# Patient Record
Sex: Female | Born: 1955
Health system: Southern US, Community
[De-identification: ages and names within clinical notes are randomized; demographics above are authoritative.]

---

## 1998-09-05 ENCOUNTER — Other Ambulatory Visit: Admission: RE | Admit: 1998-09-05 | Discharge: 1998-09-05 | Payer: Self-pay | Admitting: Obstetrics and Gynecology

## 1998-10-31 ENCOUNTER — Other Ambulatory Visit: Admission: RE | Admit: 1998-10-31 | Discharge: 1998-10-31 | Payer: Self-pay | Admitting: *Deleted

## 1999-03-17 ENCOUNTER — Other Ambulatory Visit: Admission: RE | Admit: 1999-03-17 | Discharge: 1999-03-17 | Payer: Self-pay | Admitting: *Deleted

## 2000-08-12 ENCOUNTER — Encounter: Payer: Self-pay | Admitting: *Deleted

## 2000-08-12 ENCOUNTER — Encounter: Admission: RE | Admit: 2000-08-12 | Discharge: 2000-08-12 | Payer: Self-pay | Admitting: *Deleted

## 2000-09-02 ENCOUNTER — Other Ambulatory Visit: Admission: RE | Admit: 2000-09-02 | Discharge: 2000-09-02 | Payer: Self-pay | Admitting: *Deleted

## 2001-08-26 ENCOUNTER — Encounter: Payer: Self-pay | Admitting: *Deleted

## 2001-08-26 ENCOUNTER — Encounter: Admission: RE | Admit: 2001-08-26 | Discharge: 2001-08-26 | Payer: Self-pay | Admitting: *Deleted

## 2001-08-29 ENCOUNTER — Encounter: Payer: Self-pay | Admitting: *Deleted

## 2001-08-29 ENCOUNTER — Encounter: Admission: RE | Admit: 2001-08-29 | Discharge: 2001-08-29 | Payer: Self-pay | Admitting: *Deleted

## 2001-10-08 ENCOUNTER — Other Ambulatory Visit: Admission: RE | Admit: 2001-10-08 | Discharge: 2001-10-08 | Payer: Self-pay | Admitting: Obstetrics and Gynecology

## 2002-09-08 ENCOUNTER — Encounter: Admission: RE | Admit: 2002-09-08 | Discharge: 2002-09-08 | Payer: Self-pay | Admitting: *Deleted

## 2002-09-08 ENCOUNTER — Encounter: Payer: Self-pay | Admitting: *Deleted

## 2002-10-30 ENCOUNTER — Other Ambulatory Visit: Admission: RE | Admit: 2002-10-30 | Discharge: 2002-10-30 | Payer: Self-pay | Admitting: Obstetrics and Gynecology

## 2003-10-13 ENCOUNTER — Encounter: Admission: RE | Admit: 2003-10-13 | Discharge: 2003-10-13 | Payer: Self-pay | Admitting: *Deleted

## 2004-10-20 ENCOUNTER — Encounter: Admission: RE | Admit: 2004-10-20 | Discharge: 2004-10-20 | Payer: Self-pay | Admitting: Obstetrics and Gynecology

## 2005-12-12 ENCOUNTER — Encounter: Admission: RE | Admit: 2005-12-12 | Discharge: 2005-12-12 | Payer: Self-pay | Admitting: Obstetrics and Gynecology

## 2007-10-30 ENCOUNTER — Encounter: Admission: RE | Admit: 2007-10-30 | Discharge: 2007-10-30 | Payer: Self-pay | Admitting: Obstetrics and Gynecology

## 2008-11-01 ENCOUNTER — Encounter: Admission: RE | Admit: 2008-11-01 | Discharge: 2008-11-01 | Payer: Self-pay | Admitting: Obstetrics and Gynecology

## 2009-11-02 ENCOUNTER — Encounter: Admission: RE | Admit: 2009-11-02 | Discharge: 2009-11-02 | Payer: Self-pay | Admitting: Obstetrics and Gynecology

## 2010-01-26 ENCOUNTER — Ambulatory Visit: Payer: Self-pay | Admitting: Vascular Surgery

## 2010-03-15 ENCOUNTER — Encounter (INDEPENDENT_AMBULATORY_CARE_PROVIDER_SITE_OTHER): Payer: Self-pay | Admitting: *Deleted

## 2010-03-17 ENCOUNTER — Telehealth: Payer: Self-pay | Admitting: Gastroenterology

## 2010-03-17 ENCOUNTER — Ambulatory Visit: Payer: Self-pay | Admitting: Gastroenterology

## 2010-06-14 ENCOUNTER — Telehealth: Payer: Self-pay | Admitting: Gastroenterology

## 2010-06-16 ENCOUNTER — Ambulatory Visit: Payer: Self-pay | Admitting: Gastroenterology

## 2010-09-17 ENCOUNTER — Encounter: Payer: Self-pay | Admitting: Obstetrics and Gynecology

## 2010-09-26 NOTE — Miscellaneous (Signed)
Summary: DIR COL SCR-AGE/Previsit/rm  Clinical Lists Changes  Medications: Added new medication of MOVIPREP 100 GM  SOLR (PEG-KCL-NACL-NASULF-NA ASC-C) As per prep instructions. - Signed Rx of MOVIPREP 100 GM  SOLR (PEG-KCL-NACL-NASULF-NA ASC-C) As per prep instructions.;  #1 x 0;  Signed;  Entered by: Sherren Kerns RN;  Authorized by: Louis Meckel MD;  Method used: Electronically to [facility]*, [address], [address] Shipping/mailing, [place], [place]  [address], Ph: [phone], Fax: [phone] Observations: Added new observation of ALLERGY REV: Done (03/17/2010 8:59) Added new observation of NKA: T (03/17/2010 8:59)    Prescriptions: MOVIPREP 100 GM  SOLR (PEG-KCL-NACL-NASULF-NA ASC-C) As per prep instructions.  #1 x 0   Entered by:   Sherren Kerns RN   Authorized by:   Louis Meckel MD   Signed by:   Sherren Kerns RN on 03/17/2010   Method used:   Electronically to        [facility]* (retail)       [address]       [address] Shipping/mailing       [place], [place]  [address]       Ph: [phone]       Fax: [phone]   RxID:   [id number]

## 2010-09-26 NOTE — Procedures (Signed)
Summary: Colonoscopy  Patient: Kim Mcintosh Note: All result statuses are Final unless otherwise noted.  Tests: (1) Colonoscopy (COL)   COL Colonoscopy           DONE (C)     [facility]     [address]     [place], [place]  [address]           COLONOSCOPY PROCEDURE REPORT           PATIENT:  Kim, Mcintosh  MR#:  [id number]     BIRTHDATE:  1955/10/09, 54 yrs. old  GENDER:  female           ENDOSCOPIST:  Barbette Hair. Arlyce Dice, MD     Referred by:           PROCEDURE DATE:  06/16/2010     PROCEDURE:  Diagnostic Colonoscopy     ASA CLASS:  Class II     INDICATIONS:  1) Routine Risk Screening           MEDICATIONS:   Fentanyl 100 mcg IV, Versed 10 mg IV, Benadryl 50     mg IV           DESCRIPTION OF PROCEDURE:   After the risks benefits and     alternatives of the procedure were thoroughly explained, informed     consent was obtained.  Digital rectal exam was performed and     revealed no abnormalities.   The LB CF-H180AL [id number] endoscope     was introduced through the anus and advanced to the cecum, which     was identified by both the appendix and ileocecal valve, without     limitations.  The quality of the prep was excellent, using     MiraLax.  The instrument was then slowly withdrawn as the colon     was fully examined.     <<PROCEDUREIMAGES>>           FINDINGS:  Mild diverticulosis was found in the sigmoid colon (see     image1).  This was otherwise a normal examination of the colon     (see image3, image4, image6, image8, image9, image10, image14, and     image16).   Retroflexed views in the rectum revealed no     abnormalities.    The time to cecum =  7.25  minutes. The scope     was then withdrawn (time =  6.25  min) from the patient and the     procedure completed.           COMPLICATIONS:  None           ENDOSCOPIC IMPRESSION:     1) Mild diverticulosis in the sigmoid colon     2) Otherwise normal examination     RECOMMENDATIONS:     1) Continue  current colorectal screening recommendations for     "routine risk" patients with a repeat colonoscopy in 10 years.           REPEAT EXAM:  In 10 year(s) for Colonoscopy.     Sedation with MAC for future     procedures           ______________________________     Barbette Hair. Arlyce Dice, MD           CC: Chilton Greathouse, MD           n.     REVISED:  06/16/2010 10:26 AM     eSIGNED:   Molly Maduro  Rosalio Macadamia at 06/16/2010 10:26 AM           [place], [id number]  Note: An exclamation mark (!) indicates a result that was not dispersed into the flowsheet. Document Creation Date: 06/16/2010 10:26 AM _______________________________________________________________________  (1) Order result status: Final Collection or observation date-time: 06/16/2010 10:04 Requested date-time:  Receipt date-time:  Reported date-time:  Referring Physician:   Ordering Physician: Melvia Heaps [phone]) Specimen Source:  Source: Launa Grill Order Number: [id number] Lab site:   Appended Document: Colonoscopy    Clinical Lists Changes  Observations: Added new observation of COLONNXTDUE: 05/2020 (06/16/2010 13:50)

## 2010-09-26 NOTE — Progress Notes (Signed)
Summary: Question about her COL  Phone Note Call from Patient Call back at Work Phone [phone]   Caller: Patient Call For: Dr. Arlyce Dice Summary of Call: pt. has some questions about her COL Initial call taken by: Karna Christmas,  March 17, 2010 2:37 PM  Follow-up for Phone Call        call patient back,she is "at lunch",message left for her to call me back. Sherren Kerns RN  March 17, 2010 3:49 PM  Follow-up by: Sherren Kerns RN,  March 17, 2010 3:49 PM  Additional Follow-up for Phone Call Additional follow up Details #1::        spoke with patient via  phone, answered questions/concerns. Additional Follow-up by: Sherren Kerns RN,  March 17, 2010 4:31 PM

## 2010-09-26 NOTE — Progress Notes (Signed)
Summary: Questions about Meds.  Phone Note Call from Patient Call back at Work Phone [phone]   Caller: Patient Call For: Dr. Arlyce Dice Summary of Call: Pt is now taking Methotrexate...wants to know if it will interfere with her COL Initial call taken by: Karna Christmas,  June 14, 2010 9:13 AM  Follow-up for Phone Call        Phone Call Completed Follow-up by: Wyona Almas RN,  June 14, 2010 12:42 PM

## 2010-09-26 NOTE — Letter (Signed)
Summary: [facility] Instructions  [facility]  [address] [place], [place] [address]   Phone: [phone]  Fax: [phone]       Kim Mcintosh    06-11-1956    MRN: [id number]        Procedure Day /Date:  Friday 03/31/2010     Arrival Time: 9:00 am     Procedure Time: 10:00 am     Location of Procedure:                    _x _  [facility] (4th Floor)                        PREPARATION FOR COLONOSCOPY WITH MOVIPREP   Starting 5 days prior to your procedure Sunday 7/31 do not eat nuts, seeds, popcorn, corn, beans, peas,  salads, or any raw vegetables.  Do not take any fiber supplements (e.g. Metamucil, Citrucel, and Benefiber).  THE DAY BEFORE YOUR PROCEDURE         DATE: Thursday 8/4 1.  Drink clear liquids the entire day-NO SOLID FOOD  2.  Do not drink anything colored red or purple.  Avoid juices with pulp.  No orange juice.  3.  Drink at least 64 oz. (8 glasses) of fluid/clear liquids during the day to prevent dehydration and help the prep work efficiently.  CLEAR LIQUIDS INCLUDE: Water Jello Ice Popsicles Tea (sugar ok, no milk/cream) Powdered fruit flavored drinks Coffee (sugar ok, no milk/cream) Gatorade Juice: apple, white grape, white cranberry  Lemonade Clear bullion, consomm, broth Carbonated beverages (any kind) Strained chicken noodle soup Hard Candy                             4.  In the morning, mix first dose of MoviPrep solution:    Empty 1 Pouch A and 1 Pouch B into the disposable container    Add lukewarm drinking water to the top line of the container. Mix to dissolve    Refrigerate (mixed solution should be used within 24 hrs)  5.  Begin drinking the prep at 5:00 p.m. The MoviPrep container is divided by 4 marks.   Every 15 minutes drink the solution down to the next mark (approximately 8 oz) until the full liter is complete.   6.  Follow completed prep with 16 oz of clear liquid of your choice (Nothing red or  purple).  Continue to drink clear liquids until bedtime.  7.  Before going to bed, mix second dose of MoviPrep solution:    Empty 1 Pouch A and 1 Pouch B into the disposable container    Add lukewarm drinking water to the top line of the container. Mix to dissolve    Refrigerate  THE DAY OF YOUR PROCEDURE      DATE: Friday 8/5  Beginning at 5:00 a.m. (5 hours before procedure):         1. Every 15 minutes, drink the solution down to the next mark (approx 8 oz) until the full liter is complete.  2. Follow completed prep with 16 oz. of clear liquid of your choice.    3. You may drink clear liquids until 8:00 am (2 HOURS BEFORE PROCEDURE).   MEDICATION INSTRUCTIONS  Unless otherwise instructed, you should take regular prescription medications with a small sip of water   as early as possible the morning of your procedure.  Additional medication instructions: hold diuretic pill morning of procedure only         OTHER INSTRUCTIONS  You will need a responsible adult at least 55 years of age to accompany you and drive you home.   This person must remain in the waiting room during your procedure.  Wear loose fitting clothing that is easily removed.  Leave jewelry and other valuables at home.  However, you may wish to bring a book to read or  an iPod/MP3 player to listen to music as you wait for your procedure to start.  Remove all body piercing jewelry and leave at home.  Total time from sign-in until discharge is approximately 2-3 hours.  You should go home directly after your procedure and rest.  You can resume normal activities the  day after your procedure.  The day of your procedure you should not:   Drive   Make legal decisions   Operate machinery   Drink alcohol   Return to work  You will receive specific instructions about eating, activities and medications before you leave.    The above instructions have been reviewed and explained to me by  Sherren Kerns RN  March 17, 2010 9:22 AM     I fully understand and can verbalize these instructions _____________________________ Date _________

## 2010-11-20 ENCOUNTER — Other Ambulatory Visit: Payer: Self-pay | Admitting: Obstetrics and Gynecology

## 2010-11-20 DIAGNOSIS — Z1231 Encounter for screening mammogram for malignant neoplasm of breast: Secondary | ICD-10-CM

## 2010-11-28 ENCOUNTER — Ambulatory Visit
Admission: RE | Admit: 2010-11-28 | Discharge: 2010-11-28 | Disposition: A | Discharge status: Home / Self Care | Payer: Commercial Managed Care - PPO | Source: Ambulatory Visit | Attending: Obstetrics and Gynecology | Admitting: Obstetrics and Gynecology

## 2010-11-28 DIAGNOSIS — Z1231 Encounter for screening mammogram for malignant neoplasm of breast: Secondary | ICD-10-CM

## 2011-01-09 NOTE — Procedures (Signed)
DUPLEX DEEP VENOUS EXAM - LOWER EXTREMITY   INDICATION:  Edema.   HISTORY:  Edema:  Right leg.  Trauma/Surgery:  Pain:  No.  PE:  No.  Previous DVT:  No.  Anticoagulants:  No.  Other:   DUPLEX EXAM:                CFV   SFV   PopV  PTV    GSV                R  L  R  L  R  L  R   L  R  L  Thrombosis    0  0  0     0     0      0  Spontaneous   +  +  +     +     +      +  Phasic        +  +  +     +     +      +  Augmentation  +  +  +     +     +      +  Compressible  +  +  +     +     +      +  Competent     +  +  +     +     +      +   Legend:  + - yes  o - no  p - partial  D - decreased   IMPRESSION:  1. The right leg appears to be negative for deep venous thrombosis.  2. Preliminary report called in to Dr. Vicente Males office, spoke with      Herbert Seta.    _____________________________  Di Kindle. Edilia Bo, M.D.   NT/MEDQ  D:  01/26/2010  T:  01/26/2010  Job:  [id number]

## 2011-12-12 ENCOUNTER — Other Ambulatory Visit (HOSPITAL_COMMUNITY): Payer: Self-pay | Admitting: Obstetrics and Gynecology

## 2011-12-12 DIAGNOSIS — Z1231 Encounter for screening mammogram for malignant neoplasm of breast: Secondary | ICD-10-CM

## 2012-01-01 ENCOUNTER — Other Ambulatory Visit: Payer: Self-pay | Admitting: Obstetrics and Gynecology

## 2012-01-01 DIAGNOSIS — Z1231 Encounter for screening mammogram for malignant neoplasm of breast: Secondary | ICD-10-CM

## 2012-01-29 ENCOUNTER — Ambulatory Visit: Payer: Commercial Managed Care - PPO

## 2012-02-05 ENCOUNTER — Ambulatory Visit: Payer: Commercial Managed Care - PPO

## 2012-03-05 ENCOUNTER — Ambulatory Visit
Admission: RE | Admit: 2012-03-05 | Discharge: 2012-03-05 | Disposition: A | Discharge status: Home / Self Care | Payer: Commercial Managed Care - PPO | Source: Ambulatory Visit | Attending: Obstetrics and Gynecology | Admitting: Obstetrics and Gynecology

## 2012-03-05 DIAGNOSIS — Z1231 Encounter for screening mammogram for malignant neoplasm of breast: Secondary | ICD-10-CM

## 2013-07-07 ENCOUNTER — Other Ambulatory Visit (HOSPITAL_COMMUNITY): Payer: Self-pay | Admitting: Obstetrics and Gynecology

## 2013-07-07 DIAGNOSIS — Z1231 Encounter for screening mammogram for malignant neoplasm of breast: Secondary | ICD-10-CM

## 2013-07-22 ENCOUNTER — Ambulatory Visit (HOSPITAL_COMMUNITY)
Admission: RE | Admit: 2013-07-22 | Discharge: 2013-07-22 | Disposition: A | Discharge status: Home / Self Care | Payer: 59 | Source: Ambulatory Visit | Attending: Obstetrics and Gynecology | Admitting: Obstetrics and Gynecology

## 2013-07-22 DIAGNOSIS — Z1231 Encounter for screening mammogram for malignant neoplasm of breast: Secondary | ICD-10-CM

## 2013-07-22 DIAGNOSIS — Z124 Encounter for screening for malignant neoplasm of cervix: Secondary | ICD-10-CM | POA: Insufficient documentation

## 2014-10-06 ENCOUNTER — Other Ambulatory Visit (HOSPITAL_COMMUNITY): Payer: Self-pay | Admitting: Obstetrics and Gynecology

## 2014-10-06 DIAGNOSIS — Z1231 Encounter for screening mammogram for malignant neoplasm of breast: Secondary | ICD-10-CM

## 2014-10-08 ENCOUNTER — Ambulatory Visit (HOSPITAL_COMMUNITY)
Admission: RE | Admit: 2014-10-08 | Discharge: 2014-10-08 | Disposition: A | Discharge status: Home / Self Care | Payer: 59 | Source: Ambulatory Visit | Attending: Obstetrics and Gynecology | Admitting: Obstetrics and Gynecology

## 2014-10-08 DIAGNOSIS — Z1231 Encounter for screening mammogram for malignant neoplasm of breast: Secondary | ICD-10-CM | POA: Insufficient documentation

## 2015-02-25 ENCOUNTER — Encounter: Payer: Self-pay | Admitting: Gastroenterology

## 2015-09-08 MED FILL — FOLIC ACID 1 MG TABLET: 1 | 90 days supply | Qty: 90 | Fill #1 | Status: TO

## 2015-09-08 MED FILL — LEVOTHYROXINE 75 MCG TABLET: 75 | 90 days supply | Qty: 90 | Fill #0 | Status: TO

## 2015-09-08 MED FILL — METHOTREXATE 25 MG/ML VIAL: 50 | 42 days supply | Qty: 4 | Fill #0

## 2015-09-12 DIAGNOSIS — D225 Melanocytic nevi of trunk: Secondary | ICD-10-CM | POA: Diagnosis not present

## 2015-09-12 DIAGNOSIS — D1801 Hemangioma of skin and subcutaneous tissue: Secondary | ICD-10-CM | POA: Diagnosis not present

## 2015-09-12 DIAGNOSIS — L821 Other seborrheic keratosis: Secondary | ICD-10-CM | POA: Diagnosis not present

## 2015-10-05 ENCOUNTER — Other Ambulatory Visit: Payer: Self-pay

## 2015-10-05 DIAGNOSIS — Z1231 Encounter for screening mammogram for malignant neoplasm of breast: Secondary | ICD-10-CM

## 2015-10-13 ENCOUNTER — Ambulatory Visit
Admission: RE | Admit: 2015-10-13 | Discharge: 2015-10-13 | Disposition: A | Discharge status: Home / Self Care | Payer: 59 | Source: Ambulatory Visit

## 2015-10-13 DIAGNOSIS — Z1231 Encounter for screening mammogram for malignant neoplasm of breast: Secondary | ICD-10-CM

## 2015-10-26 MED FILL — BD TB SYRINGE 27GX1/2: 27G X 1/2" | 105 days supply | Qty: 15 | Fill #1

## 2015-10-26 MED FILL — METHOTREXATE 25 MG/ML VIAL: 50 | 42 days supply | Qty: 4 | Fill #1 | Status: TO

## 2015-12-16 MED FILL — LEVOTHYROXINE 75 MCG TABLET: 75 | 4 days supply | Qty: 4 | Fill #1 | Status: TO

## 2015-12-21 DIAGNOSIS — E039 Hypothyroidism, unspecified: Secondary | ICD-10-CM | POA: Diagnosis not present

## 2015-12-21 DIAGNOSIS — E559 Vitamin D deficiency, unspecified: Secondary | ICD-10-CM | POA: Diagnosis not present

## 2015-12-21 DIAGNOSIS — Z0000 Encounter for general adult medical examination without abnormal findings: Secondary | ICD-10-CM | POA: Diagnosis not present

## 2015-12-21 DIAGNOSIS — E784 Other hyperlipidemia: Secondary | ICD-10-CM | POA: Diagnosis not present

## 2015-12-28 DIAGNOSIS — Z1389 Encounter for screening for other disorder: Secondary | ICD-10-CM | POA: Diagnosis not present

## 2015-12-28 DIAGNOSIS — F39 Unspecified mood [affective] disorder: Secondary | ICD-10-CM | POA: Diagnosis not present

## 2015-12-28 DIAGNOSIS — M069 Rheumatoid arthritis, unspecified: Secondary | ICD-10-CM | POA: Diagnosis not present

## 2015-12-28 DIAGNOSIS — E784 Other hyperlipidemia: Secondary | ICD-10-CM | POA: Diagnosis not present

## 2015-12-28 DIAGNOSIS — Z0000 Encounter for general adult medical examination without abnormal findings: Secondary | ICD-10-CM | POA: Diagnosis not present

## 2015-12-28 DIAGNOSIS — M8580 Other specified disorders of bone density and structure, unspecified site: Secondary | ICD-10-CM | POA: Diagnosis not present

## 2015-12-30 DIAGNOSIS — Z1212 Encounter for screening for malignant neoplasm of rectum: Secondary | ICD-10-CM | POA: Diagnosis not present

## 2016-02-17 DIAGNOSIS — R87610 Atypical squamous cells of undetermined significance on cytologic smear of cervix (ASC-US): Secondary | ICD-10-CM | POA: Diagnosis not present

## 2016-02-17 DIAGNOSIS — Z01419 Encounter for gynecological examination (general) (routine) without abnormal findings: Secondary | ICD-10-CM | POA: Diagnosis not present

## 2016-02-17 DIAGNOSIS — Z6823 Body mass index (BMI) 23.0-23.9, adult: Secondary | ICD-10-CM | POA: Diagnosis not present

## 2016-03-14 DIAGNOSIS — E038 Other specified hypothyroidism: Secondary | ICD-10-CM | POA: Diagnosis not present

## 2016-04-25 DIAGNOSIS — M0579 Rheumatoid arthritis with rheumatoid factor of multiple sites without organ or systems involvement: Secondary | ICD-10-CM | POA: Diagnosis not present

## 2016-04-25 DIAGNOSIS — M79642 Pain in left hand: Secondary | ICD-10-CM | POA: Diagnosis not present

## 2016-04-25 DIAGNOSIS — M7702 Medial epicondylitis, left elbow: Secondary | ICD-10-CM | POA: Diagnosis not present

## 2016-04-25 DIAGNOSIS — M79641 Pain in right hand: Secondary | ICD-10-CM | POA: Diagnosis not present

## 2016-04-25 DIAGNOSIS — Z79899 Other long term (current) drug therapy: Secondary | ICD-10-CM | POA: Diagnosis not present

## 2016-05-23 DIAGNOSIS — Z23 Encounter for immunization: Secondary | ICD-10-CM | POA: Diagnosis not present

## 2016-07-09 DIAGNOSIS — Z713 Dietary counseling and surveillance: Secondary | ICD-10-CM | POA: Diagnosis not present

## 2016-07-24 DIAGNOSIS — Z713 Dietary counseling and surveillance: Secondary | ICD-10-CM | POA: Diagnosis not present

## 2016-07-30 DIAGNOSIS — Z713 Dietary counseling and surveillance: Secondary | ICD-10-CM | POA: Diagnosis not present

## 2016-08-07 DIAGNOSIS — Z713 Dietary counseling and surveillance: Secondary | ICD-10-CM | POA: Diagnosis not present

## 2016-09-03 DIAGNOSIS — Z713 Dietary counseling and surveillance: Secondary | ICD-10-CM | POA: Diagnosis not present

## 2016-09-17 DIAGNOSIS — Z713 Dietary counseling and surveillance: Secondary | ICD-10-CM | POA: Diagnosis not present

## 2016-11-16 ENCOUNTER — Other Ambulatory Visit: Payer: Self-pay | Admitting: Obstetrics and Gynecology

## 2016-11-16 DIAGNOSIS — Z1231 Encounter for screening mammogram for malignant neoplasm of breast: Secondary | ICD-10-CM

## 2016-11-23 ENCOUNTER — Ambulatory Visit
Admission: RE | Admit: 2016-11-23 | Discharge: 2016-11-23 | Disposition: A | Discharge status: Home / Self Care | Payer: BLUE CROSS/BLUE SHIELD | Source: Ambulatory Visit | Attending: Obstetrics and Gynecology | Admitting: Obstetrics and Gynecology

## 2016-11-23 ENCOUNTER — Other Ambulatory Visit: Payer: Self-pay | Admitting: Obstetrics and Gynecology

## 2016-11-23 DIAGNOSIS — Z1231 Encounter for screening mammogram for malignant neoplasm of breast: Secondary | ICD-10-CM

## 2017-02-28 DIAGNOSIS — E038 Other specified hypothyroidism: Secondary | ICD-10-CM | POA: Diagnosis not present

## 2017-04-03 DIAGNOSIS — Z0000 Encounter for general adult medical examination without abnormal findings: Secondary | ICD-10-CM | POA: Diagnosis not present

## 2017-04-03 DIAGNOSIS — E038 Other specified hypothyroidism: Secondary | ICD-10-CM | POA: Diagnosis not present

## 2017-04-03 DIAGNOSIS — M859 Disorder of bone density and structure, unspecified: Secondary | ICD-10-CM | POA: Diagnosis not present

## 2017-04-11 DIAGNOSIS — M859 Disorder of bone density and structure, unspecified: Secondary | ICD-10-CM | POA: Diagnosis not present

## 2017-04-11 DIAGNOSIS — E784 Other hyperlipidemia: Secondary | ICD-10-CM | POA: Diagnosis not present

## 2017-04-11 DIAGNOSIS — Z1389 Encounter for screening for other disorder: Secondary | ICD-10-CM | POA: Diagnosis not present

## 2017-04-11 DIAGNOSIS — M069 Rheumatoid arthritis, unspecified: Secondary | ICD-10-CM | POA: Diagnosis not present

## 2017-04-11 DIAGNOSIS — J302 Other seasonal allergic rhinitis: Secondary | ICD-10-CM | POA: Diagnosis not present

## 2017-04-11 DIAGNOSIS — Z0000 Encounter for general adult medical examination without abnormal findings: Secondary | ICD-10-CM | POA: Diagnosis not present

## 2017-04-12 DIAGNOSIS — Z1212 Encounter for screening for malignant neoplasm of rectum: Secondary | ICD-10-CM | POA: Diagnosis not present

## 2017-04-24 DIAGNOSIS — B9789 Other viral agents as the cause of diseases classified elsewhere: Secondary | ICD-10-CM | POA: Diagnosis not present

## 2017-04-24 DIAGNOSIS — J45909 Unspecified asthma, uncomplicated: Secondary | ICD-10-CM | POA: Diagnosis not present

## 2017-04-24 DIAGNOSIS — R05 Cough: Secondary | ICD-10-CM | POA: Diagnosis not present

## 2017-04-24 DIAGNOSIS — J302 Other seasonal allergic rhinitis: Secondary | ICD-10-CM | POA: Diagnosis not present

## 2017-05-08 DIAGNOSIS — Z713 Dietary counseling and surveillance: Secondary | ICD-10-CM | POA: Diagnosis not present

## 2017-05-22 DIAGNOSIS — Z713 Dietary counseling and surveillance: Secondary | ICD-10-CM | POA: Diagnosis not present

## 2017-05-24 DIAGNOSIS — Z23 Encounter for immunization: Secondary | ICD-10-CM | POA: Diagnosis not present

## 2017-06-05 DIAGNOSIS — Z713 Dietary counseling and surveillance: Secondary | ICD-10-CM | POA: Diagnosis not present

## 2017-06-19 DIAGNOSIS — Z713 Dietary counseling and surveillance: Secondary | ICD-10-CM | POA: Diagnosis not present

## 2017-07-03 DIAGNOSIS — Z713 Dietary counseling and surveillance: Secondary | ICD-10-CM | POA: Diagnosis not present

## 2017-07-10 DIAGNOSIS — Z713 Dietary counseling and surveillance: Secondary | ICD-10-CM | POA: Diagnosis not present

## 2017-09-18 DIAGNOSIS — Z713 Dietary counseling and surveillance: Secondary | ICD-10-CM | POA: Diagnosis not present

## 2017-10-02 DIAGNOSIS — Z713 Dietary counseling and surveillance: Secondary | ICD-10-CM | POA: Diagnosis not present

## 2017-10-16 DIAGNOSIS — Z713 Dietary counseling and surveillance: Secondary | ICD-10-CM | POA: Diagnosis not present

## 2017-10-30 DIAGNOSIS — Z713 Dietary counseling and surveillance: Secondary | ICD-10-CM | POA: Diagnosis not present

## 2017-11-04 ENCOUNTER — Other Ambulatory Visit: Payer: Self-pay | Admitting: Obstetrics and Gynecology

## 2017-11-04 DIAGNOSIS — H0230 Blepharochalasis unspecified eye, unspecified eyelid: Secondary | ICD-10-CM | POA: Diagnosis not present

## 2017-11-04 DIAGNOSIS — Z139 Encounter for screening, unspecified: Secondary | ICD-10-CM

## 2017-11-13 DIAGNOSIS — Z713 Dietary counseling and surveillance: Secondary | ICD-10-CM | POA: Diagnosis not present

## 2017-11-25 ENCOUNTER — Ambulatory Visit: Payer: BLUE CROSS/BLUE SHIELD

## 2017-11-27 DIAGNOSIS — Z713 Dietary counseling and surveillance: Secondary | ICD-10-CM | POA: Diagnosis not present

## 2017-11-28 ENCOUNTER — Ambulatory Visit
Admission: RE | Admit: 2017-11-28 | Discharge: 2017-11-28 | Disposition: A | Discharge status: Home / Self Care | Payer: BLUE CROSS/BLUE SHIELD | Source: Ambulatory Visit | Attending: Obstetrics and Gynecology | Admitting: Obstetrics and Gynecology

## 2017-11-28 DIAGNOSIS — Z1231 Encounter for screening mammogram for malignant neoplasm of breast: Secondary | ICD-10-CM | POA: Diagnosis not present

## 2017-11-28 DIAGNOSIS — Z139 Encounter for screening, unspecified: Secondary | ICD-10-CM

## 2018-04-17 DIAGNOSIS — Z79899 Other long term (current) drug therapy: Secondary | ICD-10-CM | POA: Diagnosis not present

## 2018-04-17 DIAGNOSIS — M0579 Rheumatoid arthritis with rheumatoid factor of multiple sites without organ or systems involvement: Secondary | ICD-10-CM | POA: Diagnosis not present

## 2018-04-17 DIAGNOSIS — M7989 Other specified soft tissue disorders: Secondary | ICD-10-CM | POA: Diagnosis not present

## 2018-04-17 DIAGNOSIS — M79643 Pain in unspecified hand: Secondary | ICD-10-CM | POA: Diagnosis not present

## 2018-04-17 DIAGNOSIS — M79605 Pain in left leg: Secondary | ICD-10-CM | POA: Diagnosis not present

## 2018-04-29 DIAGNOSIS — Z0000 Encounter for general adult medical examination without abnormal findings: Secondary | ICD-10-CM | POA: Diagnosis not present

## 2018-04-29 DIAGNOSIS — M859 Disorder of bone density and structure, unspecified: Secondary | ICD-10-CM | POA: Diagnosis not present

## 2018-04-29 DIAGNOSIS — M8580 Other specified disorders of bone density and structure, unspecified site: Secondary | ICD-10-CM | POA: Diagnosis not present

## 2018-04-29 DIAGNOSIS — E038 Other specified hypothyroidism: Secondary | ICD-10-CM | POA: Diagnosis not present

## 2018-05-06 DIAGNOSIS — E7849 Other hyperlipidemia: Secondary | ICD-10-CM | POA: Diagnosis not present

## 2018-05-06 DIAGNOSIS — Z0000 Encounter for general adult medical examination without abnormal findings: Secondary | ICD-10-CM | POA: Diagnosis not present

## 2018-05-06 DIAGNOSIS — M859 Disorder of bone density and structure, unspecified: Secondary | ICD-10-CM | POA: Diagnosis not present

## 2018-05-06 DIAGNOSIS — M0689 Other specified rheumatoid arthritis, multiple sites: Secondary | ICD-10-CM | POA: Diagnosis not present

## 2018-05-06 DIAGNOSIS — Z1389 Encounter for screening for other disorder: Secondary | ICD-10-CM | POA: Diagnosis not present

## 2018-05-06 DIAGNOSIS — J302 Other seasonal allergic rhinitis: Secondary | ICD-10-CM | POA: Diagnosis not present

## 2018-05-09 DIAGNOSIS — Z1212 Encounter for screening for malignant neoplasm of rectum: Secondary | ICD-10-CM | POA: Diagnosis not present

## 2018-05-23 DIAGNOSIS — Z23 Encounter for immunization: Secondary | ICD-10-CM | POA: Diagnosis not present

## 2018-05-27 DIAGNOSIS — Z13820 Encounter for screening for osteoporosis: Secondary | ICD-10-CM | POA: Diagnosis not present

## 2018-07-01 DIAGNOSIS — Z713 Dietary counseling and surveillance: Secondary | ICD-10-CM | POA: Diagnosis not present

## 2018-07-08 DIAGNOSIS — Z713 Dietary counseling and surveillance: Secondary | ICD-10-CM | POA: Diagnosis not present

## 2018-07-15 DIAGNOSIS — Z713 Dietary counseling and surveillance: Secondary | ICD-10-CM | POA: Diagnosis not present

## 2018-07-29 DIAGNOSIS — Z713 Dietary counseling and surveillance: Secondary | ICD-10-CM | POA: Diagnosis not present

## 2018-08-12 DIAGNOSIS — Z713 Dietary counseling and surveillance: Secondary | ICD-10-CM | POA: Diagnosis not present

## 2018-09-01 DIAGNOSIS — M0579 Rheumatoid arthritis with rheumatoid factor of multiple sites without organ or systems involvement: Secondary | ICD-10-CM | POA: Diagnosis not present

## 2018-09-01 DIAGNOSIS — Z79899 Other long term (current) drug therapy: Secondary | ICD-10-CM | POA: Diagnosis not present

## 2018-09-02 DIAGNOSIS — Z79899 Other long term (current) drug therapy: Secondary | ICD-10-CM | POA: Diagnosis not present

## 2018-09-02 DIAGNOSIS — M0579 Rheumatoid arthritis with rheumatoid factor of multiple sites without organ or systems involvement: Secondary | ICD-10-CM | POA: Diagnosis not present

## 2018-09-09 DIAGNOSIS — Z713 Dietary counseling and surveillance: Secondary | ICD-10-CM | POA: Diagnosis not present

## 2018-12-24 ENCOUNTER — Other Ambulatory Visit: Payer: Self-pay | Admitting: Obstetrics and Gynecology

## 2018-12-24 DIAGNOSIS — Z1231 Encounter for screening mammogram for malignant neoplasm of breast: Secondary | ICD-10-CM

## 2018-12-25 DIAGNOSIS — Z6826 Body mass index (BMI) 26.0-26.9, adult: Secondary | ICD-10-CM | POA: Diagnosis not present

## 2018-12-25 DIAGNOSIS — Z124 Encounter for screening for malignant neoplasm of cervix: Secondary | ICD-10-CM | POA: Diagnosis not present

## 2018-12-25 DIAGNOSIS — R635 Abnormal weight gain: Secondary | ICD-10-CM | POA: Diagnosis not present

## 2018-12-25 DIAGNOSIS — Z113 Encounter for screening for infections with a predominantly sexual mode of transmission: Secondary | ICD-10-CM | POA: Diagnosis not present

## 2018-12-25 DIAGNOSIS — Z1151 Encounter for screening for human papillomavirus (HPV): Secondary | ICD-10-CM | POA: Diagnosis not present

## 2018-12-25 DIAGNOSIS — Z01419 Encounter for gynecological examination (general) (routine) without abnormal findings: Secondary | ICD-10-CM | POA: Diagnosis not present

## 2019-02-10 DIAGNOSIS — E7849 Other hyperlipidemia: Secondary | ICD-10-CM | POA: Diagnosis not present

## 2019-02-10 DIAGNOSIS — M859 Disorder of bone density and structure, unspecified: Secondary | ICD-10-CM | POA: Diagnosis not present

## 2019-02-10 DIAGNOSIS — Z0000 Encounter for general adult medical examination without abnormal findings: Secondary | ICD-10-CM | POA: Diagnosis not present

## 2019-02-10 DIAGNOSIS — E038 Other specified hypothyroidism: Secondary | ICD-10-CM | POA: Diagnosis not present

## 2019-02-17 DIAGNOSIS — M069 Rheumatoid arthritis, unspecified: Secondary | ICD-10-CM | POA: Diagnosis not present

## 2019-02-17 DIAGNOSIS — E785 Hyperlipidemia, unspecified: Secondary | ICD-10-CM | POA: Diagnosis not present

## 2019-02-17 DIAGNOSIS — J302 Other seasonal allergic rhinitis: Secondary | ICD-10-CM | POA: Diagnosis not present

## 2019-02-17 DIAGNOSIS — M8580 Other specified disorders of bone density and structure, unspecified site: Secondary | ICD-10-CM | POA: Diagnosis not present

## 2019-02-17 DIAGNOSIS — Z0000 Encounter for general adult medical examination without abnormal findings: Secondary | ICD-10-CM | POA: Diagnosis not present

## 2019-02-19 ENCOUNTER — Ambulatory Visit
Admission: RE | Admit: 2019-02-19 | Discharge: 2019-02-19 | Disposition: A | Discharge status: Home / Self Care | Payer: BC Managed Care – PPO | Source: Ambulatory Visit | Attending: Obstetrics and Gynecology | Admitting: Obstetrics and Gynecology

## 2019-02-19 ENCOUNTER — Other Ambulatory Visit: Payer: Self-pay

## 2019-02-19 DIAGNOSIS — Z1231 Encounter for screening mammogram for malignant neoplasm of breast: Secondary | ICD-10-CM

## 2019-03-05 DIAGNOSIS — Z79899 Other long term (current) drug therapy: Secondary | ICD-10-CM | POA: Diagnosis not present

## 2019-03-05 DIAGNOSIS — M0579 Rheumatoid arthritis with rheumatoid factor of multiple sites without organ or systems involvement: Secondary | ICD-10-CM | POA: Diagnosis not present

## 2019-05-28 DIAGNOSIS — Z23 Encounter for immunization: Secondary | ICD-10-CM | POA: Diagnosis not present

## 2019-06-08 DIAGNOSIS — M0579 Rheumatoid arthritis with rheumatoid factor of multiple sites without organ or systems involvement: Secondary | ICD-10-CM | POA: Diagnosis not present

## 2019-07-01 DIAGNOSIS — Z713 Dietary counseling and surveillance: Secondary | ICD-10-CM | POA: Diagnosis not present

## 2019-07-02 DIAGNOSIS — L570 Actinic keratosis: Secondary | ICD-10-CM | POA: Diagnosis not present

## 2019-07-02 DIAGNOSIS — L821 Other seborrheic keratosis: Secondary | ICD-10-CM | POA: Diagnosis not present

## 2019-09-16 DIAGNOSIS — Z79899 Other long term (current) drug therapy: Secondary | ICD-10-CM | POA: Diagnosis not present

## 2019-09-16 DIAGNOSIS — M0579 Rheumatoid arthritis with rheumatoid factor of multiple sites without organ or systems involvement: Secondary | ICD-10-CM | POA: Diagnosis not present

## 2019-09-23 DIAGNOSIS — M0579 Rheumatoid arthritis with rheumatoid factor of multiple sites without organ or systems involvement: Secondary | ICD-10-CM | POA: Diagnosis not present

## 2019-09-23 DIAGNOSIS — Z79899 Other long term (current) drug therapy: Secondary | ICD-10-CM | POA: Diagnosis not present

## 2019-11-01 ENCOUNTER — Ambulatory Visit: Payer: Self-pay | Attending: Internal Medicine

## 2019-11-01 DIAGNOSIS — Z23 Encounter for immunization: Secondary | ICD-10-CM | POA: Insufficient documentation

## 2019-11-01 NOTE — Progress Notes (Signed)
   Covid-19 Vaccination Clinic  Name:  Kim Mcintosh    MRN: [id number] DOB: 06-18-1956  11/01/2019  Kim Mcintosh was observed post Covid-19 immunization for 15 minutes without incident. She was provided with Vaccine Information Sheet and instruction to access the V-Safe system.   Kim Mcintosh was instructed to call 911 with any severe reactions post vaccine: Marland Kitchen Difficulty breathing  . Swelling of face and throat  . A fast heartbeat  . A bad rash all over body  . Dizziness and weakness   Immunizations Administered    Name Date Dose VIS Date Route   Pfizer COVID-19 Vaccine 11/01/2019  6:24 PM 0.3 mL 08/07/2019 Intramuscular   Manufacturer: [place]   Lot: [id number]   [place]: [id number]

## 2019-11-16 DIAGNOSIS — L570 Actinic keratosis: Secondary | ICD-10-CM | POA: Diagnosis not present

## 2019-12-08 ENCOUNTER — Ambulatory Visit: Payer: Self-pay | Attending: Internal Medicine

## 2019-12-08 DIAGNOSIS — Z23 Encounter for immunization: Secondary | ICD-10-CM

## 2019-12-08 NOTE — Progress Notes (Signed)
   Covid-19 Vaccination Clinic  Name:  Kim Mcintosh    MRN: [id number] DOB: 21-Sep-1955  12/08/2019  Kim Mcintosh was observed post Covid-19 immunization for 15 minutes without incident. She was provided with Vaccine Information Sheet and instruction to access the V-Safe system.   Kim Mcintosh was instructed to call 911 with any severe reactions post vaccine: Marland Kitchen Difficulty breathing  . Swelling of face and throat  . A fast heartbeat  . A bad rash all over body  . Dizziness and weakness   Immunizations Administered    Name Date Dose VIS Date Route   Pfizer COVID-19 Vaccine 12/08/2019  1:38 PM 0.3 mL 08/07/2019 Intramuscular   Manufacturer: [place]   Lot: [id number]   [place]: [id number]

## 2020-01-08 DIAGNOSIS — Z01419 Encounter for gynecological examination (general) (routine) without abnormal findings: Secondary | ICD-10-CM | POA: Diagnosis not present

## 2020-01-08 DIAGNOSIS — Z6825 Body mass index (BMI) 25.0-25.9, adult: Secondary | ICD-10-CM | POA: Diagnosis not present

## 2020-03-02 DIAGNOSIS — Z0000 Encounter for general adult medical examination without abnormal findings: Secondary | ICD-10-CM | POA: Diagnosis not present

## 2020-03-02 DIAGNOSIS — R739 Hyperglycemia, unspecified: Secondary | ICD-10-CM | POA: Diagnosis not present

## 2020-03-02 DIAGNOSIS — E7849 Other hyperlipidemia: Secondary | ICD-10-CM | POA: Diagnosis not present

## 2020-03-02 DIAGNOSIS — E038 Other specified hypothyroidism: Secondary | ICD-10-CM | POA: Diagnosis not present

## 2020-03-02 DIAGNOSIS — M859 Disorder of bone density and structure, unspecified: Secondary | ICD-10-CM | POA: Diagnosis not present

## 2020-03-07 DIAGNOSIS — M069 Rheumatoid arthritis, unspecified: Secondary | ICD-10-CM | POA: Diagnosis not present

## 2020-03-07 DIAGNOSIS — R739 Hyperglycemia, unspecified: Secondary | ICD-10-CM | POA: Diagnosis not present

## 2020-03-07 DIAGNOSIS — Z0000 Encounter for general adult medical examination without abnormal findings: Secondary | ICD-10-CM | POA: Diagnosis not present

## 2020-03-07 DIAGNOSIS — Z8249 Family history of ischemic heart disease and other diseases of the circulatory system: Secondary | ICD-10-CM | POA: Diagnosis not present

## 2020-03-07 DIAGNOSIS — M8580 Other specified disorders of bone density and structure, unspecified site: Secondary | ICD-10-CM | POA: Diagnosis not present

## 2020-03-07 DIAGNOSIS — E785 Hyperlipidemia, unspecified: Secondary | ICD-10-CM | POA: Diagnosis not present

## 2020-03-08 ENCOUNTER — Other Ambulatory Visit: Payer: Self-pay | Admitting: Internal Medicine

## 2020-03-08 DIAGNOSIS — E785 Hyperlipidemia, unspecified: Secondary | ICD-10-CM

## 2020-03-08 DIAGNOSIS — Z8249 Family history of ischemic heart disease and other diseases of the circulatory system: Secondary | ICD-10-CM

## 2020-03-14 DIAGNOSIS — Z23 Encounter for immunization: Secondary | ICD-10-CM | POA: Diagnosis not present

## 2020-03-14 DIAGNOSIS — Z1331 Encounter for screening for depression: Secondary | ICD-10-CM | POA: Diagnosis not present

## 2020-03-16 ENCOUNTER — Other Ambulatory Visit: Payer: Self-pay | Admitting: Obstetrics and Gynecology

## 2020-03-16 DIAGNOSIS — Z1231 Encounter for screening mammogram for malignant neoplasm of breast: Secondary | ICD-10-CM

## 2020-03-23 ENCOUNTER — Ambulatory Visit
Admission: RE | Admit: 2020-03-23 | Discharge: 2020-03-23 | Disposition: A | Discharge status: Home / Self Care | Payer: BC Managed Care – PPO | Source: Ambulatory Visit | Attending: Obstetrics and Gynecology | Admitting: Obstetrics and Gynecology

## 2020-03-23 ENCOUNTER — Other Ambulatory Visit: Payer: Self-pay

## 2020-03-23 DIAGNOSIS — Z1231 Encounter for screening mammogram for malignant neoplasm of breast: Secondary | ICD-10-CM

## 2020-03-29 ENCOUNTER — Ambulatory Visit
Admission: RE | Admit: 2020-03-29 | Discharge: 2020-03-29 | Disposition: A | Discharge status: Home / Self Care | Payer: No Typology Code available for payment source | Source: Ambulatory Visit | Attending: Internal Medicine | Admitting: Internal Medicine

## 2020-03-29 DIAGNOSIS — E785 Hyperlipidemia, unspecified: Secondary | ICD-10-CM

## 2020-03-29 DIAGNOSIS — Z8249 Family history of ischemic heart disease and other diseases of the circulatory system: Secondary | ICD-10-CM | POA: Diagnosis not present

## 2020-04-20 ENCOUNTER — Other Ambulatory Visit: Payer: Self-pay

## 2020-04-26 ENCOUNTER — Ambulatory Visit: Payer: Self-pay | Attending: Internal Medicine

## 2020-04-26 DIAGNOSIS — Z23 Encounter for immunization: Secondary | ICD-10-CM

## 2020-04-26 NOTE — Progress Notes (Signed)
   Covid-19 Vaccination Clinic  Name:  BREELEY BISCHOF    MRN: [id number] DOB: [birth year]  04/26/2020  Ms. Bridgers was observed post Covid-19 immunization for 15 minutes without incident. She was provided with Vaccine Information Sheet and instruction to access the V-Safe system.   Ms. Narducci was instructed to call 911 with any severe reactions post vaccine: Marland Kitchen Difficulty breathing  . Swelling of face and throat  . A fast heartbeat  . A bad rash all over body  . Dizziness and weakness

## 2020-05-26 DIAGNOSIS — Z23 Encounter for immunization: Secondary | ICD-10-CM | POA: Diagnosis not present

## 2020-06-28 DIAGNOSIS — Z713 Dietary counseling and surveillance: Secondary | ICD-10-CM | POA: Diagnosis not present

## 2020-07-12 DIAGNOSIS — Z713 Dietary counseling and surveillance: Secondary | ICD-10-CM | POA: Diagnosis not present

## 2020-07-26 DIAGNOSIS — Z713 Dietary counseling and surveillance: Secondary | ICD-10-CM | POA: Diagnosis not present

## 2020-08-09 DIAGNOSIS — Z713 Dietary counseling and surveillance: Secondary | ICD-10-CM | POA: Diagnosis not present

## 2020-08-30 DIAGNOSIS — Z713 Dietary counseling and surveillance: Secondary | ICD-10-CM | POA: Diagnosis not present

## 2020-09-06 DIAGNOSIS — Z713 Dietary counseling and surveillance: Secondary | ICD-10-CM | POA: Diagnosis not present

## 2020-09-20 DIAGNOSIS — Z713 Dietary counseling and surveillance: Secondary | ICD-10-CM | POA: Diagnosis not present

## 2020-10-07 ENCOUNTER — Encounter: Payer: Self-pay | Admitting: Gastroenterology

## 2020-11-29 DIAGNOSIS — M79671 Pain in right foot: Secondary | ICD-10-CM | POA: Diagnosis not present

## 2020-11-29 DIAGNOSIS — M2011 Hallux valgus (acquired), right foot: Secondary | ICD-10-CM | POA: Diagnosis not present

## 2020-11-29 DIAGNOSIS — M79641 Pain in right hand: Secondary | ICD-10-CM | POA: Diagnosis not present

## 2020-11-29 DIAGNOSIS — M19041 Primary osteoarthritis, right hand: Secondary | ICD-10-CM | POA: Diagnosis not present

## 2020-11-29 DIAGNOSIS — M2012 Hallux valgus (acquired), left foot: Secondary | ICD-10-CM | POA: Diagnosis not present

## 2020-11-29 DIAGNOSIS — M19042 Primary osteoarthritis, left hand: Secondary | ICD-10-CM | POA: Diagnosis not present

## 2020-11-29 DIAGNOSIS — Z79899 Other long term (current) drug therapy: Secondary | ICD-10-CM | POA: Diagnosis not present

## 2020-11-29 DIAGNOSIS — M79642 Pain in left hand: Secondary | ICD-10-CM | POA: Diagnosis not present

## 2020-11-29 DIAGNOSIS — M0579 Rheumatoid arthritis with rheumatoid factor of multiple sites without organ or systems involvement: Secondary | ICD-10-CM | POA: Diagnosis not present

## 2020-11-29 DIAGNOSIS — Z13828 Encounter for screening for other musculoskeletal disorder: Secondary | ICD-10-CM | POA: Diagnosis not present

## 2020-11-29 DIAGNOSIS — M79672 Pain in left foot: Secondary | ICD-10-CM | POA: Diagnosis not present

## 2020-11-30 DIAGNOSIS — Z79899 Other long term (current) drug therapy: Secondary | ICD-10-CM | POA: Diagnosis not present

## 2020-11-30 DIAGNOSIS — M0579 Rheumatoid arthritis with rheumatoid factor of multiple sites without organ or systems involvement: Secondary | ICD-10-CM | POA: Diagnosis not present

## 2021-01-18 ENCOUNTER — Other Ambulatory Visit: Payer: Self-pay | Admitting: Obstetrics and Gynecology

## 2021-01-18 DIAGNOSIS — Z1231 Encounter for screening mammogram for malignant neoplasm of breast: Secondary | ICD-10-CM

## 2021-02-15 DIAGNOSIS — L821 Other seborrheic keratosis: Secondary | ICD-10-CM | POA: Diagnosis not present

## 2021-03-08 IMAGING — CT CT CARDIAC CORONARY ARTERY CALCIUM SCORE
3 series · 14 of 20 positions shown, 16 images · non-contrast
Comparison: None.

CLINICAL DATA: Family history

EXAM:
CT CARDIAC CORONARY ARTERY CALCIUM SCORE
TECHNIQUE: Non-contrast imaging through the heart was performed using
prospective ECG gating. Image post processing was performed on an
independent workstation, allowing for quantitative analysis of the
heart and coronary arteries. Note that this exam targets the heart
and the chest was not imaged in its entirety.

[Series 2: calcium scoring 2.00 qr36 bestdiast 70% hrt calciu · axial · 0.40mm/px · z∈[+1536,+1632]mm · 4 of 80 slices shown]
[im 16/80  vessel]
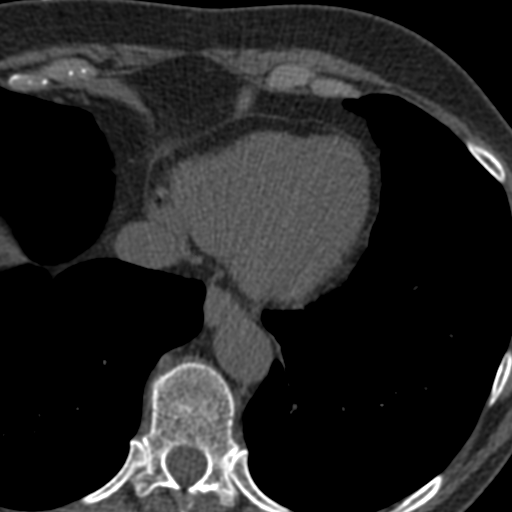
[im 32/80  vessel]
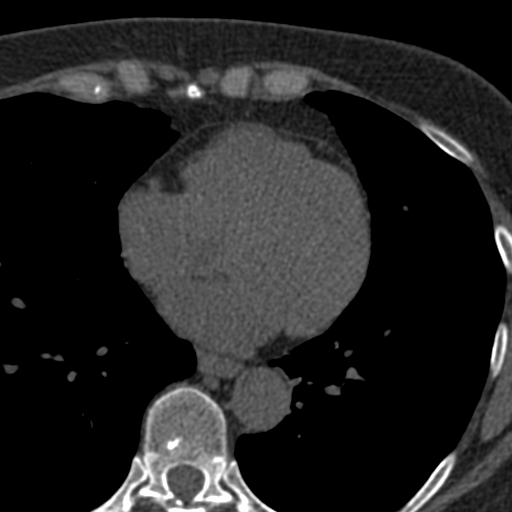
[im 48/80  vessel]
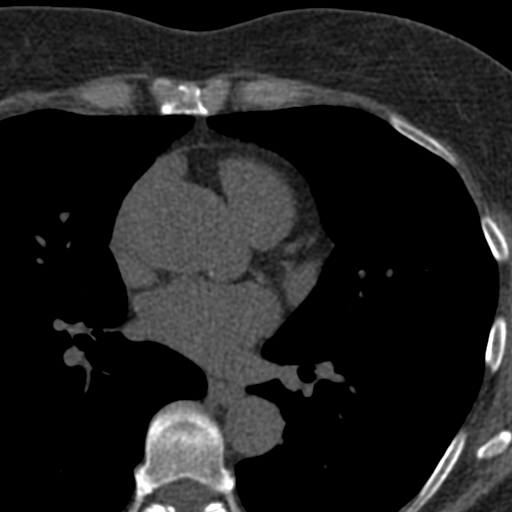
[im 64/80  vessel]
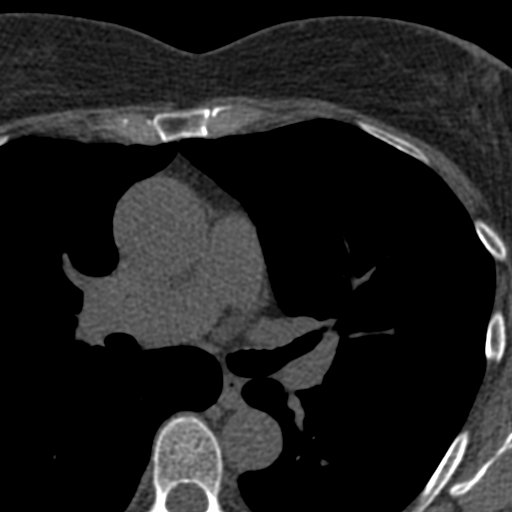

[Series 3: calcium scoring 2.00 br40 bestdiast 70% axial · axial · 0.57mm/px · z∈[+1532,+1636]mm · 5 of 80 slices shown, 7 images]
[im 14/80  vessel]
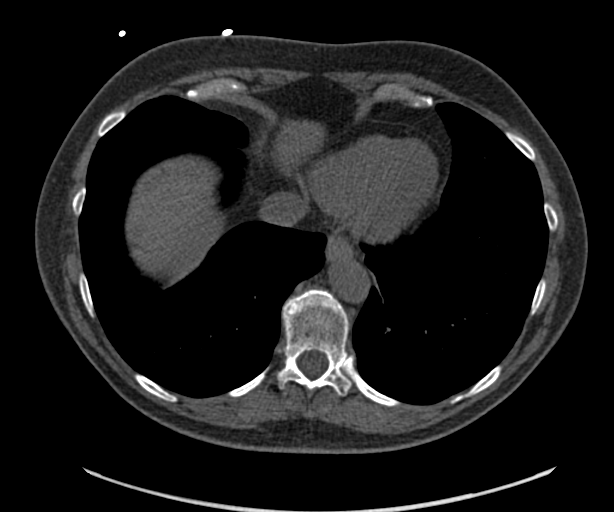
[im 14/80  lung]
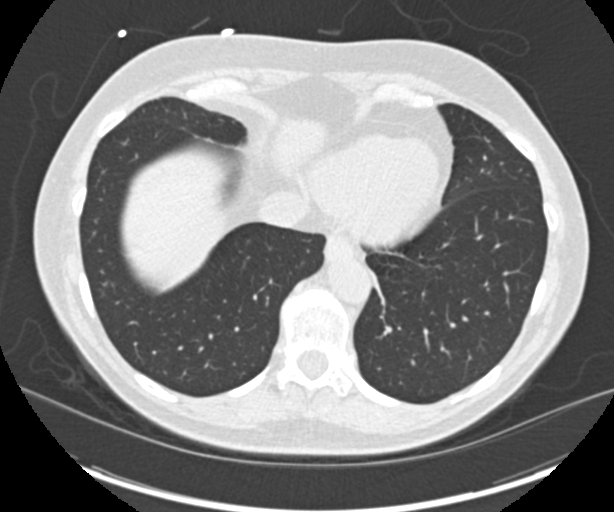
[im 27/80  vessel]
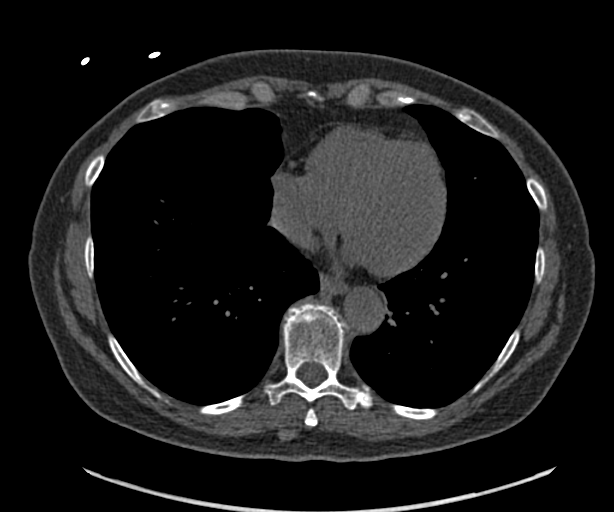
[im 40/80  vessel]
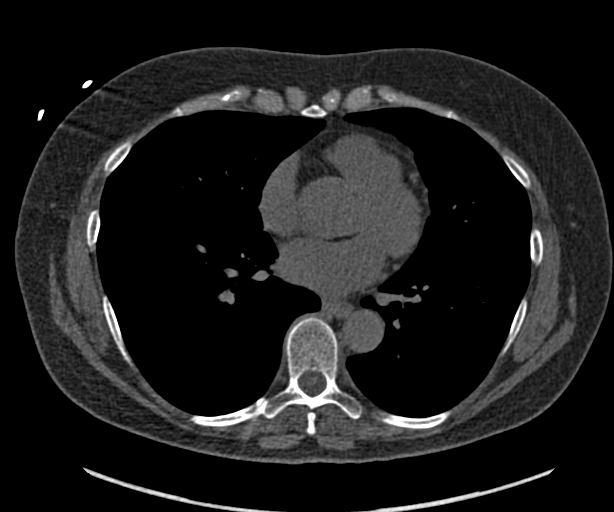
[im 53/80  vessel]
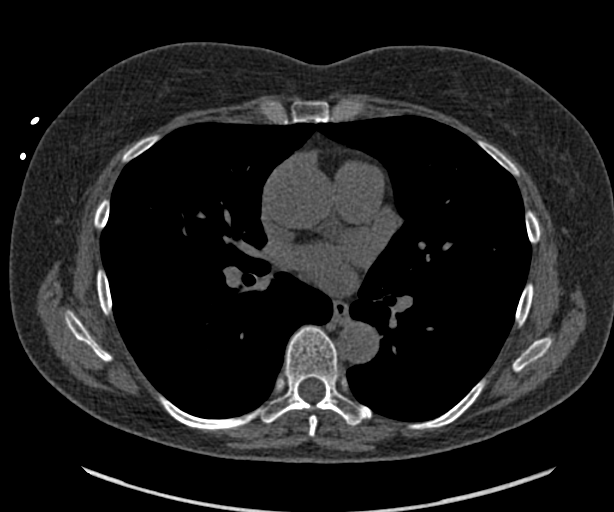
[im 66/80  vessel]
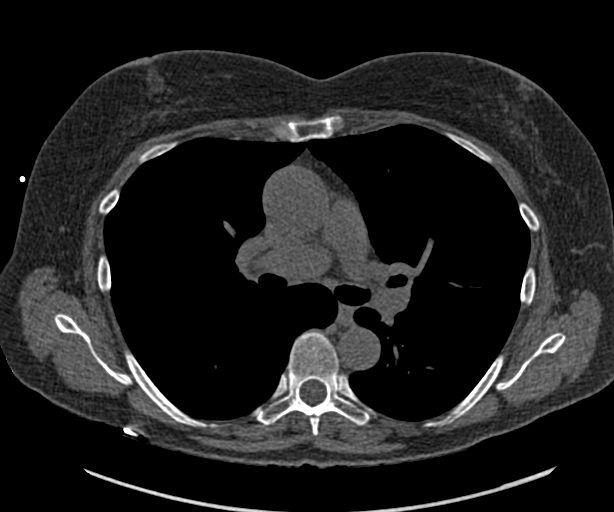
[im 66/80  lung]
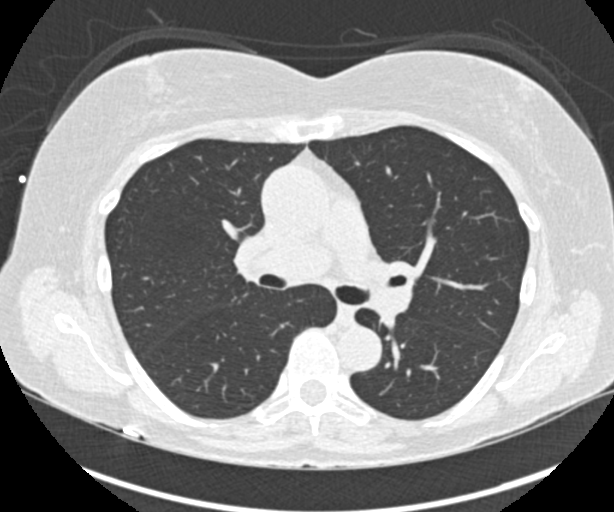

[Series 9: calcium scoring 2.00 br60 bestdiast 70% lungs · axial · 0.57mm/px · z∈[+1532,+1636]mm · 5 of 80 slices shown]
[im 14/80  vessel]
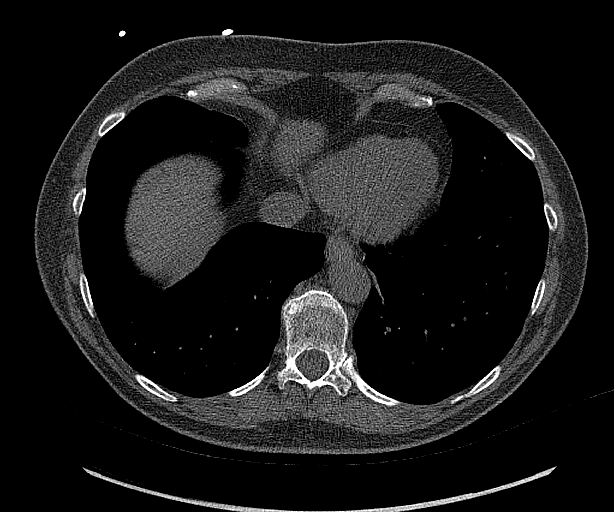
[im 27/80  vessel]
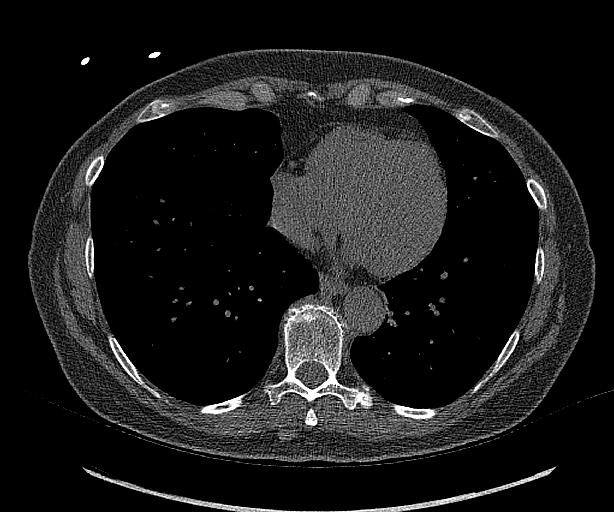
[im 40/80  vessel]
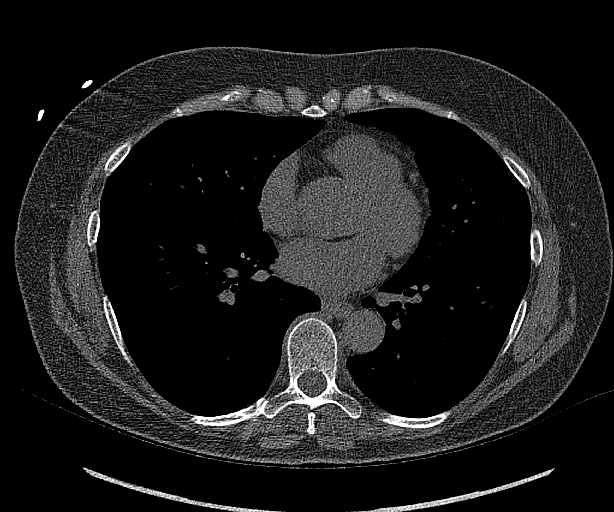
[im 53/80  vessel]
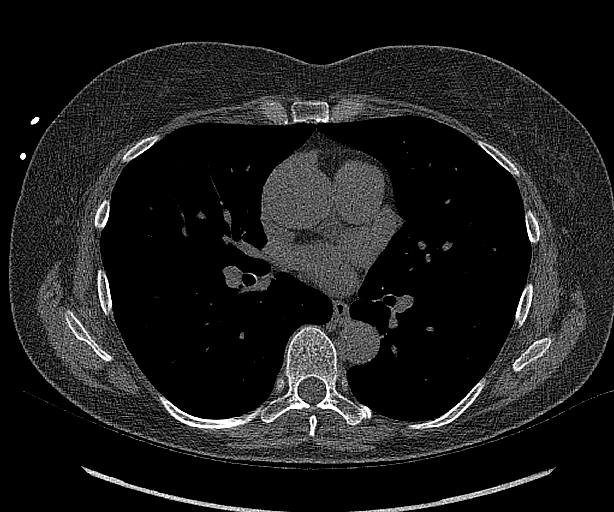
[im 66/80  vessel]
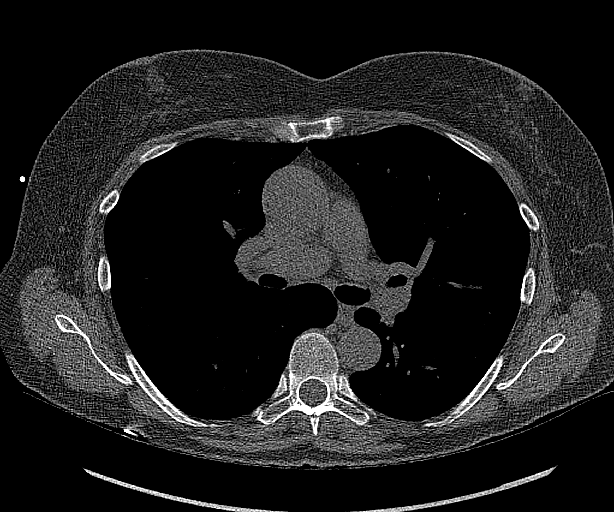

[14 of 20 positions shown; findings below may reference images not displayed]

FINDINGS: CORONARY CALCIUM SCORES:

Left Main: 0

LAD: 0

LCx: 0

RCA: 0

Total Agatston Score: 0

[HOSPITAL] percentile: 0

AORTA MEASUREMENTS:

Ascending Aorta: 37 mm

Descending Aorta: 23 mm

OTHER FINDINGS:

Heart is normal size. No adenopathy. Visualized lungs clear. No
effusions. Chest wall soft tissues are unremarkable. No acute bony
abnormality.
IMPRESSION: No visible coronary artery calcifications. Total coronary calcium
score of 0.

No acute or significant extracardiac abnormality.

## 2021-03-22 DIAGNOSIS — Z6824 Body mass index (BMI) 24.0-24.9, adult: Secondary | ICD-10-CM | POA: Diagnosis not present

## 2021-03-22 DIAGNOSIS — Z124 Encounter for screening for malignant neoplasm of cervix: Secondary | ICD-10-CM | POA: Diagnosis not present

## 2021-03-22 DIAGNOSIS — Z01419 Encounter for gynecological examination (general) (routine) without abnormal findings: Secondary | ICD-10-CM | POA: Diagnosis not present

## 2021-03-22 DIAGNOSIS — Z01411 Encounter for gynecological examination (general) (routine) with abnormal findings: Secondary | ICD-10-CM | POA: Diagnosis not present

## 2021-03-24 ENCOUNTER — Other Ambulatory Visit: Payer: Self-pay

## 2021-03-24 ENCOUNTER — Ambulatory Visit
Admission: RE | Admit: 2021-03-24 | Discharge: 2021-03-24 | Disposition: A | Discharge status: Home / Self Care | Payer: BC Managed Care – PPO | Source: Ambulatory Visit | Attending: Obstetrics and Gynecology | Admitting: Obstetrics and Gynecology

## 2021-03-24 DIAGNOSIS — Z1231 Encounter for screening mammogram for malignant neoplasm of breast: Secondary | ICD-10-CM | POA: Diagnosis not present

## 2021-03-31 DIAGNOSIS — E559 Vitamin D deficiency, unspecified: Secondary | ICD-10-CM | POA: Diagnosis not present

## 2021-03-31 DIAGNOSIS — R739 Hyperglycemia, unspecified: Secondary | ICD-10-CM | POA: Diagnosis not present

## 2021-03-31 DIAGNOSIS — E785 Hyperlipidemia, unspecified: Secondary | ICD-10-CM | POA: Diagnosis not present

## 2021-04-07 DIAGNOSIS — M8580 Other specified disorders of bone density and structure, unspecified site: Secondary | ICD-10-CM | POA: Diagnosis not present

## 2021-04-07 DIAGNOSIS — Z0000 Encounter for general adult medical examination without abnormal findings: Secondary | ICD-10-CM | POA: Diagnosis not present

## 2021-04-07 DIAGNOSIS — R82998 Other abnormal findings in urine: Secondary | ICD-10-CM | POA: Diagnosis not present

## 2021-04-27 DIAGNOSIS — M0579 Rheumatoid arthritis with rheumatoid factor of multiple sites without organ or systems involvement: Secondary | ICD-10-CM | POA: Diagnosis not present

## 2021-04-27 DIAGNOSIS — Z79899 Other long term (current) drug therapy: Secondary | ICD-10-CM | POA: Diagnosis not present

## 2021-04-27 DIAGNOSIS — M7989 Other specified soft tissue disorders: Secondary | ICD-10-CM | POA: Diagnosis not present

## 2021-04-27 DIAGNOSIS — M79643 Pain in unspecified hand: Secondary | ICD-10-CM | POA: Diagnosis not present

## 2021-05-09 DIAGNOSIS — M0579 Rheumatoid arthritis with rheumatoid factor of multiple sites without organ or systems involvement: Secondary | ICD-10-CM | POA: Diagnosis not present

## 2021-05-09 DIAGNOSIS — Z79899 Other long term (current) drug therapy: Secondary | ICD-10-CM | POA: Diagnosis not present

## 2021-05-09 DIAGNOSIS — M25561 Pain in right knee: Secondary | ICD-10-CM | POA: Diagnosis not present

## 2021-05-09 DIAGNOSIS — M79643 Pain in unspecified hand: Secondary | ICD-10-CM | POA: Diagnosis not present

## 2021-05-26 DIAGNOSIS — Z23 Encounter for immunization: Secondary | ICD-10-CM | POA: Diagnosis not present

## 2021-06-07 DIAGNOSIS — M79643 Pain in unspecified hand: Secondary | ICD-10-CM | POA: Diagnosis not present

## 2021-06-07 DIAGNOSIS — M0579 Rheumatoid arthritis with rheumatoid factor of multiple sites without organ or systems involvement: Secondary | ICD-10-CM | POA: Diagnosis not present

## 2021-06-07 DIAGNOSIS — M7120 Synovial cyst of popliteal space [Baker], unspecified knee: Secondary | ICD-10-CM | POA: Diagnosis not present

## 2021-06-07 DIAGNOSIS — Z79899 Other long term (current) drug therapy: Secondary | ICD-10-CM | POA: Diagnosis not present

## 2021-06-16 DIAGNOSIS — R252 Cramp and spasm: Secondary | ICD-10-CM | POA: Diagnosis not present

## 2021-07-03 DIAGNOSIS — Z713 Dietary counseling and surveillance: Secondary | ICD-10-CM | POA: Diagnosis not present

## 2021-07-13 DIAGNOSIS — Z713 Dietary counseling and surveillance: Secondary | ICD-10-CM | POA: Diagnosis not present

## 2021-07-17 DIAGNOSIS — Z713 Dietary counseling and surveillance: Secondary | ICD-10-CM | POA: Diagnosis not present

## 2021-07-31 DIAGNOSIS — Z713 Dietary counseling and surveillance: Secondary | ICD-10-CM | POA: Diagnosis not present

## 2021-08-14 DIAGNOSIS — Z713 Dietary counseling and surveillance: Secondary | ICD-10-CM | POA: Diagnosis not present

## 2021-09-04 DIAGNOSIS — Z713 Dietary counseling and surveillance: Secondary | ICD-10-CM | POA: Diagnosis not present

## 2021-09-07 DIAGNOSIS — Z79899 Other long term (current) drug therapy: Secondary | ICD-10-CM | POA: Diagnosis not present

## 2021-09-07 DIAGNOSIS — M0579 Rheumatoid arthritis with rheumatoid factor of multiple sites without organ or systems involvement: Secondary | ICD-10-CM | POA: Diagnosis not present

## 2021-09-18 DIAGNOSIS — Z713 Dietary counseling and surveillance: Secondary | ICD-10-CM | POA: Diagnosis not present

## 2021-10-05 DIAGNOSIS — E039 Hypothyroidism, unspecified: Secondary | ICD-10-CM | POA: Diagnosis not present

## 2021-11-16 DIAGNOSIS — F331 Major depressive disorder, recurrent, moderate: Secondary | ICD-10-CM | POA: Diagnosis not present

## 2022-02-09 ENCOUNTER — Other Ambulatory Visit: Payer: Self-pay | Admitting: Obstetrics and Gynecology

## 2022-02-09 DIAGNOSIS — Z1231 Encounter for screening mammogram for malignant neoplasm of breast: Secondary | ICD-10-CM

## 2022-03-20 DIAGNOSIS — M79643 Pain in unspecified hand: Secondary | ICD-10-CM | POA: Diagnosis not present

## 2022-03-20 DIAGNOSIS — Z79899 Other long term (current) drug therapy: Secondary | ICD-10-CM | POA: Diagnosis not present

## 2022-03-20 DIAGNOSIS — M7120 Synovial cyst of popliteal space [Baker], unspecified knee: Secondary | ICD-10-CM | POA: Diagnosis not present

## 2022-03-20 DIAGNOSIS — M0579 Rheumatoid arthritis with rheumatoid factor of multiple sites without organ or systems involvement: Secondary | ICD-10-CM | POA: Diagnosis not present

## 2022-03-26 ENCOUNTER — Ambulatory Visit
Admission: RE | Admit: 2022-03-26 | Discharge: 2022-03-26 | Disposition: A | Discharge status: Home / Self Care | Payer: BC Managed Care – PPO | Source: Ambulatory Visit | Attending: Obstetrics and Gynecology | Admitting: Obstetrics and Gynecology

## 2022-03-26 DIAGNOSIS — Z6824 Body mass index (BMI) 24.0-24.9, adult: Secondary | ICD-10-CM | POA: Diagnosis not present

## 2022-03-26 DIAGNOSIS — Z1231 Encounter for screening mammogram for malignant neoplasm of breast: Secondary | ICD-10-CM | POA: Diagnosis not present

## 2022-03-26 DIAGNOSIS — Z01419 Encounter for gynecological examination (general) (routine) without abnormal findings: Secondary | ICD-10-CM | POA: Diagnosis not present

## 2022-04-09 DIAGNOSIS — Z1211 Encounter for screening for malignant neoplasm of colon: Secondary | ICD-10-CM | POA: Diagnosis not present

## 2022-04-09 DIAGNOSIS — Z1212 Encounter for screening for malignant neoplasm of rectum: Secondary | ICD-10-CM | POA: Diagnosis not present

## 2022-04-10 DIAGNOSIS — E039 Hypothyroidism, unspecified: Secondary | ICD-10-CM | POA: Diagnosis not present

## 2022-04-10 DIAGNOSIS — E559 Vitamin D deficiency, unspecified: Secondary | ICD-10-CM | POA: Diagnosis not present

## 2022-04-10 DIAGNOSIS — E785 Hyperlipidemia, unspecified: Secondary | ICD-10-CM | POA: Diagnosis not present

## 2022-04-17 DIAGNOSIS — Z1339 Encounter for screening examination for other mental health and behavioral disorders: Secondary | ICD-10-CM | POA: Diagnosis not present

## 2022-04-17 DIAGNOSIS — Z1331 Encounter for screening for depression: Secondary | ICD-10-CM | POA: Diagnosis not present

## 2022-04-17 DIAGNOSIS — Z0000 Encounter for general adult medical examination without abnormal findings: Secondary | ICD-10-CM | POA: Diagnosis not present

## 2022-04-17 DIAGNOSIS — F331 Major depressive disorder, recurrent, moderate: Secondary | ICD-10-CM | POA: Diagnosis not present

## 2022-04-17 LAB — EXTERNAL GENERIC LAB PROCEDURE: COLOGUARD: NEGATIVE

## 2022-04-17 LAB — COLOGUARD: COLOGUARD: NEGATIVE

## 2022-05-25 DIAGNOSIS — Z23 Encounter for immunization: Secondary | ICD-10-CM | POA: Diagnosis not present

## 2022-10-04 DIAGNOSIS — M25551 Pain in right hip: Secondary | ICD-10-CM | POA: Diagnosis not present

## 2022-12-11 DIAGNOSIS — M0579 Rheumatoid arthritis with rheumatoid factor of multiple sites without organ or systems involvement: Secondary | ICD-10-CM | POA: Diagnosis not present

## 2022-12-11 DIAGNOSIS — M79643 Pain in unspecified hand: Secondary | ICD-10-CM | POA: Diagnosis not present

## 2022-12-11 DIAGNOSIS — M7120 Synovial cyst of popliteal space [Baker], unspecified knee: Secondary | ICD-10-CM | POA: Diagnosis not present

## 2022-12-11 DIAGNOSIS — Z79899 Other long term (current) drug therapy: Secondary | ICD-10-CM | POA: Diagnosis not present

## 2023-01-17 DIAGNOSIS — Z713 Dietary counseling and surveillance: Secondary | ICD-10-CM | POA: Diagnosis not present

## 2023-02-18 DIAGNOSIS — Z713 Dietary counseling and surveillance: Secondary | ICD-10-CM | POA: Diagnosis not present

## 2023-03-06 DIAGNOSIS — R739 Hyperglycemia, unspecified: Secondary | ICD-10-CM | POA: Diagnosis not present

## 2023-03-06 DIAGNOSIS — E559 Vitamin D deficiency, unspecified: Secondary | ICD-10-CM | POA: Diagnosis not present

## 2023-03-06 DIAGNOSIS — E039 Hypothyroidism, unspecified: Secondary | ICD-10-CM | POA: Diagnosis not present

## 2023-03-08 DIAGNOSIS — Z713 Dietary counseling and surveillance: Secondary | ICD-10-CM | POA: Diagnosis not present

## 2023-03-11 DIAGNOSIS — Z0000 Encounter for general adult medical examination without abnormal findings: Secondary | ICD-10-CM | POA: Diagnosis not present

## 2023-03-11 DIAGNOSIS — Z1339 Encounter for screening examination for other mental health and behavioral disorders: Secondary | ICD-10-CM | POA: Diagnosis not present

## 2023-03-11 DIAGNOSIS — F331 Major depressive disorder, recurrent, moderate: Secondary | ICD-10-CM | POA: Diagnosis not present

## 2023-03-11 DIAGNOSIS — R82998 Other abnormal findings in urine: Secondary | ICD-10-CM | POA: Diagnosis not present

## 2023-03-11 DIAGNOSIS — Z1331 Encounter for screening for depression: Secondary | ICD-10-CM | POA: Diagnosis not present

## 2023-03-13 DIAGNOSIS — Z79899 Other long term (current) drug therapy: Secondary | ICD-10-CM | POA: Diagnosis not present

## 2023-03-14 ENCOUNTER — Other Ambulatory Visit: Payer: Self-pay | Admitting: Obstetrics and Gynecology

## 2023-03-14 DIAGNOSIS — Z1231 Encounter for screening mammogram for malignant neoplasm of breast: Secondary | ICD-10-CM

## 2023-03-29 DIAGNOSIS — Z713 Dietary counseling and surveillance: Secondary | ICD-10-CM | POA: Diagnosis not present

## 2023-04-02 ENCOUNTER — Ambulatory Visit: Admission: RE | Admit: 2023-04-02 | Payer: BC Managed Care – PPO | Source: Ambulatory Visit

## 2023-04-02 DIAGNOSIS — Z1231 Encounter for screening mammogram for malignant neoplasm of breast: Secondary | ICD-10-CM | POA: Diagnosis not present

## 2023-04-17 DIAGNOSIS — Z713 Dietary counseling and surveillance: Secondary | ICD-10-CM | POA: Diagnosis not present

## 2023-04-18 DIAGNOSIS — Z79899 Other long term (current) drug therapy: Secondary | ICD-10-CM | POA: Diagnosis not present

## 2023-04-25 DIAGNOSIS — Z01419 Encounter for gynecological examination (general) (routine) without abnormal findings: Secondary | ICD-10-CM | POA: Diagnosis not present

## 2023-05-01 DIAGNOSIS — Z79899 Other long term (current) drug therapy: Secondary | ICD-10-CM | POA: Diagnosis not present

## 2023-05-15 DIAGNOSIS — Z713 Dietary counseling and surveillance: Secondary | ICD-10-CM | POA: Diagnosis not present

## 2023-05-31 DIAGNOSIS — Z23 Encounter for immunization: Secondary | ICD-10-CM | POA: Diagnosis not present

## 2023-09-19 DIAGNOSIS — M79643 Pain in unspecified hand: Secondary | ICD-10-CM | POA: Diagnosis not present

## 2023-09-19 DIAGNOSIS — M0579 Rheumatoid arthritis with rheumatoid factor of multiple sites without organ or systems involvement: Secondary | ICD-10-CM | POA: Diagnosis not present

## 2023-09-19 DIAGNOSIS — M1990 Unspecified osteoarthritis, unspecified site: Secondary | ICD-10-CM | POA: Diagnosis not present

## 2023-09-19 DIAGNOSIS — Z79899 Other long term (current) drug therapy: Secondary | ICD-10-CM | POA: Diagnosis not present

## 2023-12-05 DIAGNOSIS — Z713 Dietary counseling and surveillance: Secondary | ICD-10-CM | POA: Diagnosis not present

## 2023-12-19 DIAGNOSIS — Z79899 Other long term (current) drug therapy: Secondary | ICD-10-CM | POA: Diagnosis not present

## 2023-12-26 DIAGNOSIS — Z713 Dietary counseling and surveillance: Secondary | ICD-10-CM | POA: Diagnosis not present

## 2024-01-13 DIAGNOSIS — R635 Abnormal weight gain: Secondary | ICD-10-CM | POA: Diagnosis not present

## 2024-01-14 DIAGNOSIS — L821 Other seborrheic keratosis: Secondary | ICD-10-CM | POA: Diagnosis not present

## 2024-01-14 DIAGNOSIS — S40862A Insect bite (nonvenomous) of left upper arm, initial encounter: Secondary | ICD-10-CM | POA: Diagnosis not present

## 2024-01-30 DIAGNOSIS — Z713 Dietary counseling and surveillance: Secondary | ICD-10-CM | POA: Diagnosis not present

## 2024-02-10 DIAGNOSIS — R5383 Other fatigue: Secondary | ICD-10-CM | POA: Diagnosis not present

## 2024-02-10 DIAGNOSIS — R635 Abnormal weight gain: Secondary | ICD-10-CM | POA: Diagnosis not present

## 2024-02-10 DIAGNOSIS — Z6824 Body mass index (BMI) 24.0-24.9, adult: Secondary | ICD-10-CM | POA: Diagnosis not present

## 2024-02-13 DIAGNOSIS — Z713 Dietary counseling and surveillance: Secondary | ICD-10-CM | POA: Diagnosis not present

## 2024-02-26 ENCOUNTER — Other Ambulatory Visit: Payer: Self-pay | Admitting: Obstetrics and Gynecology

## 2024-02-26 DIAGNOSIS — Z0000 Encounter for general adult medical examination without abnormal findings: Secondary | ICD-10-CM

## 2024-02-27 DIAGNOSIS — Z713 Dietary counseling and surveillance: Secondary | ICD-10-CM | POA: Diagnosis not present

## 2024-03-11 DIAGNOSIS — Z131 Encounter for screening for diabetes mellitus: Secondary | ICD-10-CM | POA: Diagnosis not present

## 2024-03-11 DIAGNOSIS — Z1212 Encounter for screening for malignant neoplasm of rectum: Secondary | ICD-10-CM | POA: Diagnosis not present

## 2024-03-11 DIAGNOSIS — M8580 Other specified disorders of bone density and structure, unspecified site: Secondary | ICD-10-CM | POA: Diagnosis not present

## 2024-03-11 DIAGNOSIS — E785 Hyperlipidemia, unspecified: Secondary | ICD-10-CM | POA: Diagnosis not present

## 2024-03-11 DIAGNOSIS — E039 Hypothyroidism, unspecified: Secondary | ICD-10-CM | POA: Diagnosis not present

## 2024-03-18 DIAGNOSIS — Z1331 Encounter for screening for depression: Secondary | ICD-10-CM | POA: Diagnosis not present

## 2024-03-18 DIAGNOSIS — Z1339 Encounter for screening examination for other mental health and behavioral disorders: Secondary | ICD-10-CM | POA: Diagnosis not present

## 2024-03-18 DIAGNOSIS — M069 Rheumatoid arthritis, unspecified: Secondary | ICD-10-CM | POA: Diagnosis not present

## 2024-03-18 DIAGNOSIS — Z0000 Encounter for general adult medical examination without abnormal findings: Secondary | ICD-10-CM | POA: Diagnosis not present

## 2024-03-18 DIAGNOSIS — R82998 Other abnormal findings in urine: Secondary | ICD-10-CM | POA: Diagnosis not present

## 2024-03-19 DIAGNOSIS — Z79899 Other long term (current) drug therapy: Secondary | ICD-10-CM | POA: Diagnosis not present

## 2024-03-19 DIAGNOSIS — M0579 Rheumatoid arthritis with rheumatoid factor of multiple sites without organ or systems involvement: Secondary | ICD-10-CM | POA: Diagnosis not present

## 2024-03-19 DIAGNOSIS — M1990 Unspecified osteoarthritis, unspecified site: Secondary | ICD-10-CM | POA: Diagnosis not present

## 2024-03-19 DIAGNOSIS — M79643 Pain in unspecified hand: Secondary | ICD-10-CM | POA: Diagnosis not present

## 2024-04-02 DIAGNOSIS — Z713 Dietary counseling and surveillance: Secondary | ICD-10-CM | POA: Diagnosis not present

## 2024-04-03 ENCOUNTER — Ambulatory Visit

## 2024-04-17 ENCOUNTER — Ambulatory Visit
Admission: RE | Admit: 2024-04-17 | Discharge: 2024-04-17 | Disposition: A | Discharge status: Home / Self Care | Source: Ambulatory Visit | Attending: Obstetrics and Gynecology | Admitting: Obstetrics and Gynecology

## 2024-04-17 ENCOUNTER — Other Ambulatory Visit: Payer: Self-pay | Admitting: Internal Medicine

## 2024-04-17 DIAGNOSIS — Z0000 Encounter for general adult medical examination without abnormal findings: Secondary | ICD-10-CM

## 2024-04-17 DIAGNOSIS — Z1231 Encounter for screening mammogram for malignant neoplasm of breast: Secondary | ICD-10-CM | POA: Diagnosis not present

## 2024-04-22 ENCOUNTER — Other Ambulatory Visit: Payer: Self-pay | Admitting: Internal Medicine

## 2024-04-22 DIAGNOSIS — R928 Other abnormal and inconclusive findings on diagnostic imaging of breast: Secondary | ICD-10-CM

## 2024-04-30 ENCOUNTER — Ambulatory Visit
Admission: RE | Admit: 2024-04-30 | Discharge: 2024-04-30 | Disposition: A | Discharge status: Home / Self Care | Source: Ambulatory Visit | Attending: Internal Medicine | Admitting: Internal Medicine

## 2024-04-30 ENCOUNTER — Ambulatory Visit

## 2024-04-30 DIAGNOSIS — R928 Other abnormal and inconclusive findings on diagnostic imaging of breast: Secondary | ICD-10-CM | POA: Diagnosis not present

## 2024-04-30 DIAGNOSIS — Z713 Dietary counseling and surveillance: Secondary | ICD-10-CM | POA: Diagnosis not present

## 2024-05-29 DIAGNOSIS — Z23 Encounter for immunization: Secondary | ICD-10-CM | POA: Diagnosis not present

## 2024-06-19 DIAGNOSIS — Z79899 Other long term (current) drug therapy: Secondary | ICD-10-CM | POA: Diagnosis not present
